# Patient Record
Sex: Male | Born: 2005 | Hispanic: No | Marital: Single
Health system: Southern US, Community
[De-identification: ages and names within clinical notes are randomized; demographics above are authoritative.]

## PROBLEM LIST (undated history)

## (undated) DIAGNOSIS — Z789 Other specified health status: Secondary | ICD-10-CM

## (undated) DIAGNOSIS — Z9109 Other allergy status, other than to drugs and biological substances: Secondary | ICD-10-CM

## (undated) HISTORY — DX: Other specified health status: Z78.9

---
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
## 2023-12-28 ENCOUNTER — Ambulatory Visit
Admission: EM | Admit: 2023-12-28 | Discharge: 2023-12-28 | Disposition: A | Discharge status: Home / Self Care | Attending: Emergency Medicine | Admitting: Emergency Medicine

## 2023-12-28 DIAGNOSIS — L03012 Cellulitis of left finger: Secondary | ICD-10-CM | POA: Diagnosis not present

## 2023-12-28 DIAGNOSIS — L089 Local infection of the skin and subcutaneous tissue, unspecified: Secondary | ICD-10-CM | POA: Diagnosis not present

## 2023-12-28 DIAGNOSIS — T148XXA Other injury of unspecified body region, initial encounter: Secondary | ICD-10-CM | POA: Diagnosis not present

## 2023-12-28 DIAGNOSIS — L03113 Cellulitis of right upper limb: Secondary | ICD-10-CM

## 2023-12-28 MED ORDER — DOXYCYCLINE HYCLATE 100 MG PO CAPS: 100.0000 mg | ORAL_CAPSULE | Freq: Two times a day (BID) | ORAL | 0 refills | Status: AC

## 2023-12-28 MED ORDER — TETANUS-DIPHTH-ACELL PERTUSSIS 5-2.5-18.5 LF-MCG/0.5 IM SUSY: 0.5000 mL | PREFILLED_SYRINGE | Freq: Once | INTRAMUSCULAR | Status: DC

## 2023-12-28 NOTE — ED Provider Notes (Signed)
 Harold Rhodes    CSN: [id number] Arrival date & time: 12/28/23  1030      History   Chief Complaint Chief Complaint  Patient presents with   Laceration   Hand Injury    HPI Harold Rhodes is a 18 y.o. male.  Patient presents with pain, swelling, redness on his left palm at the base of his little finger x 4 days.  He had a laceration approximately 1 week ago.  He did not seek treatment at that time.  The laceration healed but he is concerned for infection.  No fever or purulent drainage.  Last tetanus 2016.  The history is provided by the patient and medical records.    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 No pertinent surgical history.     Home Medications    Prior to Admission medications   Medication Sig Start Date End Date Taking? Authorizing Provider  doxycycline  (VIBRAMYCIN ) 100 MG capsule Take 1 capsule (100 mg total) by mouth 2 (two) times daily for 7 days. 12/28/23 01/04/24 Yes Corlis Burnard DEL, NP  cetirizine  (ZYRTEC ) 10 MG tablet TAKE 1 TABLET BY MOUTH EVERY DAY AT BEDTIME AND WHEN 07/17/20   Stanley, Angela J, MD  Clindamycin -Benzoyl Per, Refr, (DUAC) gel Apply 1 application topically 2 (two) times daily. 02/27/20   Dozier Nat CROME, MD  fluticasone  (FLONASE ) 50 MCG/ACT nasal spray Place 2 sprays into both nostrils daily. Computer only  enters ml or g - please give one bottle and refill as needed. 03/08/19 04/07/19  Romualdo Will BROCKS, MD    Family History Family History  Problem Relation Age of Onset   Alcohol abuse Father    Hyperlipidemia Maternal Grandmother    Hearing loss Maternal Grandfather        war injury   Hyperlipidemia Maternal Grandfather    Hyperlipidemia Paternal Grandmother    Hyperlipidemia Paternal Grandfather     Social History Social History   Tobacco Use   Smoking status: Passive Smoke Exposure - Never Smoker   Smokeless tobacco: Never  Vaping Use   Vaping status: Never Used  Substance Use Topics   Alcohol use: No   Drug use: Yes    Types: Marijuana    Comment: states last time was last week     Allergies   Patient has no known allergies.   Review of Systems Review of Systems  Constitutional:  Negative for chills and fever.  Musculoskeletal:  Positive for joint swelling. Negative for arthralgias.  Skin:  Positive for color change and wound.  Neurological:  Negative for weakness and numbness.     Physical Exam Triage Vital Signs ED Triage Vitals  Encounter Vitals Group     BP 12/28/23 1137 122/79     Girls Systolic BP Percentile --      Girls Diastolic BP Percentile --      Boys Systolic BP Percentile --  Boys Diastolic BP Percentile --      Pulse Rate 12/28/23 1137 65     Resp 12/28/23 1137 18     Temp 12/28/23 1137 (!) 97.2 F (36.2 C)     Temp Source 12/28/23 1137 Temporal     SpO2 12/28/23 1137 100 %     Weight --      Height --      Head Circumference --      Peak Flow --      Pain Score 12/28/23 1136 6     Pain Loc --      Pain Education --      Exclude from Growth Chart --    No data found.  Updated Vital Signs BP 122/79 (BP Location: Left Arm)   Pulse 65   Temp (!) 97.2 F (36.2 C) (Temporal)   Resp 18   SpO2 100%   Visual Acuity Right Eye Distance:   Left Eye Distance:   Bilateral Distance:    Right Eye Near:   Left Eye Near:     Bilateral Near:     Physical Exam Constitutional:      General: He is not in acute distress. HENT:     Mouth/Throat:     Mouth: Mucous membranes are moist.  Cardiovascular:     Rate and Rhythm: Normal rate and regular rhythm.  Pulmonary:     Effort: Pulmonary effort is normal. No respiratory distress.  Musculoskeletal:        General: Swelling and tenderness present. No deformity. Normal range of motion.  Skin:    General: Skin is warm and dry.     Capillary Refill: Capillary refill takes less than 2 seconds.     Findings: Erythema and lesion present.     Comments: Left hand has full range of motion, sensation intact, brisk capillary refill, strength 5/5.  Mild edema, erythema, tenderness of left hand at the base of the little finger on the palmar side at the site of closed laceration.  Single stab incision with return of blood only; no purulent drainage.  Neurological:     General: No focal deficit present.     Mental Status: He is alert.     Sensory: No sensory deficit.     Motor: No weakness.      UC Treatments / Results  Labs (all labs ordered are listed, but only abnormal results are displayed) Labs Reviewed - No data to display  EKG   Radiology No results found.  Procedures Procedures (including critical care time)  Medications Ordered in UC Medications - No data to display   Initial Impression / Assessment and Plan / UC Course  I have reviewed the triage vital signs and the nursing notes.  Pertinent labs & imaging results that were available during my care of the patient were reviewed by me and considered in my medical decision making (see chart for details).    Cellulitis due to infected wound of the left hand at the base of the little finger.  Single stab incision at the site as blood return only and no purulent drainage.  Treating today with doxycycline .  Wound care instructions discussed.  Instructed patient to follow-up with his PCP or a hand  specialist.  Contact information for on-call orthopedic hand specialist provided.  Education provided on cellulitis and infected wounds.  Patient adamantly declines tetanus update due to fear of needles.  He agrees to plan of care.  Final Clinical Impressions(s) / UC Diagnoses  Final diagnoses:  Cellulitis of finger of left hand  Infected wound     Discharge Instructions      According to your chart, your last tetanus shot was in 2016 and needs to be updated today but you have declined this.  Take the doxycycline  as directed.    Keep your wound clean and dry.  Wash it gently twice a day with soap and water.  Then apply an antibiotic cream and bandage.    Follow up with your primary care provider or a hand specialist such as the one listed below.        ED Prescriptions     Medication Sig Dispense Auth. Provider   doxycycline  (VIBRAMYCIN ) 100 MG capsule Take 1 capsule (100 mg total) by mouth 2 (two) times daily for 7 days. 14 capsule Corlis Burnard DEL, NP      PDMP not reviewed this encounter.   Corlis Burnard DEL, NP 12/28/23 1252

## 2023-12-28 NOTE — Discharge Instructions (Addendum)
 According to your chart, your last tetanus shot was in 2016 and needs to be updated today but you have declined this.  Take the doxycycline  as directed.    Keep your wound clean and dry.  Wash it gently twice a day with soap and water.  Then apply an antibiotic cream and bandage.    Follow up with your primary care provider or a hand specialist such as the one listed below.

## 2023-12-28 NOTE — ED Triage Notes (Signed)
 Patient states that he cut his right palm of his hand about 1.5 weeks ago. Patient states that the cut was fine until about 4 days ago the area started getting red and heat to the area. Pain is 6-10. Patient is a Designer, fashion/clothing and wears gloves. Patient states that he's unsure of his last t-dap.

## 2024-01-01 ENCOUNTER — Other Ambulatory Visit: Payer: Self-pay

## 2024-01-01 ENCOUNTER — Ambulatory Visit: Admission: EM | Admit: 2024-01-01 | Discharge: 2024-01-01 | Disposition: A | Discharge status: Home / Self Care

## 2024-01-01 ENCOUNTER — Emergency Department
Admission: EM | Admit: 2024-01-01 | Discharge: 2024-01-01 | Disposition: A | Discharge status: Home / Self Care | Attending: Emergency Medicine | Admitting: Emergency Medicine

## 2024-01-01 DIAGNOSIS — L03113 Cellulitis of right upper limb: Secondary | ICD-10-CM | POA: Diagnosis not present

## 2024-01-01 DIAGNOSIS — L0291 Cutaneous abscess, unspecified: Secondary | ICD-10-CM

## 2024-01-01 DIAGNOSIS — R2231 Localized swelling, mass and lump, right upper limb: Secondary | ICD-10-CM | POA: Diagnosis present

## 2024-01-01 DIAGNOSIS — L02511 Cutaneous abscess of right hand: Secondary | ICD-10-CM | POA: Insufficient documentation

## 2024-01-01 MED ORDER — OXYCODONE-ACETAMINOPHEN 5-325 MG PO TABS
1.0000 | ORAL_TABLET | Freq: Once | ORAL | Status: AC
  Administered 2024-01-01: 1 via ORAL
  Filled 2024-01-01: qty 1

## 2024-01-01 MED ORDER — CLINDAMYCIN HCL 300 MG PO CAPS: 300.0000 mg | ORAL_CAPSULE | Freq: Three times a day (TID) | ORAL | 0 refills | Status: AC

## 2024-01-01 MED ORDER — ONDANSETRON 4 MG PO TBDP
4.0000 mg | ORAL_TABLET | Freq: Once | ORAL | Status: AC
  Administered 2024-01-01: 4 mg via ORAL
  Filled 2024-01-01: qty 1

## 2024-01-01 MED ORDER — LIDOCAINE-PRILOCAINE 2.5-2.5 % EX CREA
TOPICAL_CREAM | Freq: Once | CUTANEOUS | Status: AC
  Filled 2024-01-01: qty 5

## 2024-01-01 MED ORDER — LORAZEPAM 1 MG PO TABS
1.0000 mg | ORAL_TABLET | Freq: Once | ORAL | Status: AC
  Administered 2024-01-01: 1 mg via ORAL
  Filled 2024-01-01: qty 1

## 2024-01-01 NOTE — ED Provider Notes (Signed)
 [facility] Provider Note    Event Date/Time   First MD Initiated Contact with Patient 01/01/24 1205     (approximate)  History   Chief Complaint: Hand Injury  HPI  Harold Rhodes is a 18 y.o. male with no significant past medical history presents to the emergency department for a right hand infection.  According to the patient about 2 weeks ago he cut his hand at the base of his small finger.  He states over the past 1 week there appears to have been an infection with increased swelling and redness to the area.  He went to urgent care 4 days ago and was started on doxycycline .  Was told at that time there was nothing to drain.  He states that the swelling has gotten a little worse so he returned to urgent care today and they sent him to the emergency department.  Patient denies any fever.  States he has been taking the doxycycline .  Physical Exam   Triage Vital Signs: ED Triage Vitals  Encounter Vitals Group     BP 01/01/24 1200 (!) 151/79     Girls Systolic BP Percentile --      Girls Diastolic BP Percentile --      Boys Systolic BP Percentile --      Boys Diastolic BP Percentile --      Pulse Rate 01/01/24 1158 88     Resp 01/01/24 1158 16     Temp 01/01/24 1158 98.2 F (36.8 C)     Temp src --      SpO2 01/01/24 1158 100 %     Weight 01/01/24 1159 165 lb (74.8 kg)     Height 01/01/24 1159 6' 2 (1.88 m)     Head Circumference --      Peak Flow --      Pain Score 01/01/24 1159 8     Pain Loc --      Pain Education --      Exclude from Growth Chart --     Most recent vital signs: Vitals:   01/01/24 1158 01/01/24 1200  BP:  (!) 151/79  Pulse: 88   Resp: 16   Temp: 98.2 F (36.8 C)   SpO2: 100%     General: Awake, no distress.  CV:  Good peripheral perfusion.  Regular rate and rhythm  Resp:  Normal effort.  Equal breath sounds bilaterally.  Abd:  No distention.  Soft Other:  Patient has erythema and swelling to the base of the 5th and  4th digits on the palmar aspect of the right hand.  Possible area of fluctuance/abscess to this area.   ED Results / Procedures / Treatments    MEDICATIONS ORDERED IN ED: Medications  lidocaine -prilocaine  (EMLA ) cream (has no administration in time range)  LORazepam  (ATIVAN ) tablet 1 mg (has no administration in time range)     IMPRESSION / MDM / ASSESSMENT AND PLAN / ED COURSE  I reviewed the triage vital signs and the nursing notes.  Patient's presentation is most consistent with acute presentation with potential threat to life or bodily function.  Patient presents to the emergency department for swelling redness and pain to the base of the right hand.  The area appears consistent with cellulitis versus abscess.  I used a bedside ultrasound and there does appear to be a small fluid collection in this area.  Patient is extremely nervous, and is adamant that we not use any needles.  I spoke  to the patient regarding the infection and my desire to check labs and possibly dose IV antibiotics.  Again patient adamant that there not be any needles no blood draws or IV.  Offered to dose the patient anxiety medication but again still does not want any blood drawn or an IV.  The area of infection is fairly small, and there does appear to be a drainable portion consistent with a small abscess.  We will attempt to drain the abscess.  If we were able to drain the abscess I believe the patient could be tried again on home antibiotics such as clindamycin .  Again patient will not even allow the lidocaine  to be administered through a needle.  Will attempt to topically numb with Emla  cream.  Will dose 1 mg of oral Ativan  and attempt a drainage of this area.  After much convincing in helping hold the patient he is agreeable to attempting lancing.  It was very difficult to get the patient to hold his head still to get it done but we were able to do so.  Patient had significant amount (4+ cc) of exudate return.  We  will take the patient off doxycycline  and place the patient on clindamycin  3 times daily x 10 days have the patient follow-up with his doctor.  Patient agreeable.  INCISION AND DRAINAGE Performed by: Franky Moores Consent: Verbal consent obtained. Risks and benefits: risks, benefits and alternatives were discussed Type: abscess  Body area:  right palm  Anesthesia:  topical lidocaine   Incision was made with a scalpel.  Complexity: complex  Drainage: purulent  Drainage amount: 4+cc  Packing material: Would not tolerate     FINAL CLINICAL IMPRESSION(S) / ED DIAGNOSES   Cellulitis Abscess of the right hand   Note:  This document was prepared using Dragon voice recognition software and may include unintentional dictation errors.   Moores Franky, MD 01/01/24 1410

## 2024-01-01 NOTE — Discharge Instructions (Addendum)
 Please begin taking clindamycin  3 times daily for 10 days.  Return to the emergency department for any increased swelling redness or fever.  Otherwise follow-up with your doctor.

## 2024-01-01 NOTE — ED Triage Notes (Signed)
 Pt diagnosed with cellulitis to right hand x1 week ago, has been taking antibiotics but reports swelling and pain is worsening.

## 2024-01-01 NOTE — ED Provider Notes (Signed)
 [name]    CSN: [id number] Arrival date & time: 01/01/24  1038      History   Chief Complaint Chief Complaint  Patient presents with   Wound Infection    HPI Harold Rhodes is a 18 y.o. male.  Patient presents with increased redness, swelling, pain of his right hand at the base of his little finger where he previously had a laceration.  He is currently on doxycycline .  He denies fever or chills.  Patient was seen here on 12/28/2023 for cellulitis and infected wound of his right hand; treated with doxycycline .  Patient declined tetanus update at that visit.  The history is provided by the patient and medical records.    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 No pertinent surgical history.     Home Medications    Prior to Admission medications   Medication Sig Start Date End Date Taking? Authorizing Provider  cetirizine  (ZYRTEC ) 10 MG tablet TAKE 1 TABLET BY MOUTH EVERY DAY AT BEDTIME AND WHEN 07/17/20   Stanley, Angela J, MD  Clindamycin -Benzoyl Per, Refr, (DUAC) gel Apply 1 application topically 2 (two) times daily. 02/27/20   Dozier Nat CROME, MD  doxycycline  (VIBRAMYCIN ) 100 MG capsule Take 1 capsule (100 mg total) by mouth 2 (two) times daily for 7 days. 12/28/23 01/04/24  Corlis Burnard DEL, NP  fluticasone  (FLONASE ) 50 MCG/ACT nasal spray  Place 2 sprays into both nostrils daily. Computer only enters ml or g - please give one bottle and refill as needed. 03/08/19 04/07/19  Romualdo Will BROCKS, MD    Family History Family History  Problem Relation Age of Onset   Alcohol abuse Father    Hyperlipidemia Maternal Grandmother    Hearing loss Maternal Grandfather        war injury   Hyperlipidemia Maternal Grandfather    Hyperlipidemia Paternal Grandmother    Hyperlipidemia Paternal Grandfather     Social History Social History   Tobacco Use   Smoking status: Passive Smoke Exposure - Never Smoker   Smokeless tobacco: Never  Vaping Use   Vaping status: Never Used  Substance Use Topics   Alcohol use: No   Drug use: Yes    Types: Marijuana    Comment: states last time was last week     Allergies   Patient has no known allergies.   Review of Systems Review of Systems  Constitutional:  Negative for chills and fever.  Musculoskeletal:  Positive for arthralgias and joint swelling.  Skin:  Positive for color change and wound.     Physical Exam Triage Vital Signs ED Triage Vitals  Encounter Vitals Group     BP 01/01/24 1049 133/78     Girls Systolic BP Percentile --      Girls Diastolic BP Percentile --      Boys Systolic BP Percentile --      Boys Diastolic BP Percentile --  Pulse Rate 01/01/24 1049 98     Resp 01/01/24 1049 18     Temp 01/01/24 1049 97.8 F (36.6 C)     Temp src --      SpO2 01/01/24 1049 100 %     Weight --      Height --      Head Circumference --      Peak Flow --      Pain Score 01/01/24 1046 8     Pain Loc --      Pain Education --      Exclude from Growth Chart --    No data found.  Updated Vital Signs BP 133/78   Pulse 98   Temp 97.8 F (36.6 C)   Resp 18   SpO2 100%   Visual Acuity Right Eye Distance:   Left Eye Distance:   Bilateral Distance:    Right Eye Near:   Left Eye Near:    Bilateral Near:     Physical Exam Constitutional:      General: He is  not in acute distress. HENT:     Mouth/Throat:     Mouth: Mucous membranes are moist.  Cardiovascular:     Rate and Rhythm: Normal rate and regular rhythm.  Pulmonary:     Effort: Pulmonary effort is normal. No respiratory distress.  Musculoskeletal:        General: Swelling and tenderness present.  Skin:    General: Skin is warm and dry.     Findings: Erythema present.     Comments: Cellulitis of right hand is worse than previous visit, despite being on doxycycline  which patient reports he is taking as directed.  Increased erythema and edema.  See pictures for details.  Neurological:     General: No focal deficit present.     Mental Status: He is alert.     Sensory: No sensory deficit.     Motor: No weakness.         UC Treatments / Results  Labs (all labs ordered are listed, but only abnormal results are displayed) Labs Reviewed - No data to display  EKG   Radiology No results found.  Procedures Procedures (including critical care time)  Medications Ordered in UC Medications - No data to display  Initial Impression / Assessment and Plan / UC Course  I have reviewed the triage vital signs and the nursing notes.  Pertinent labs & imaging results that were available during my care of the patient were reviewed by me and considered in my medical decision making (see chart for details).    Worsening cellulitis of right hand.  Patient is currently on doxycycline  and his symptoms are markedly worse.  He has increased swelling, redness, pain.  Sending him to the ED for evaluation.  He is agreeable to this and will go to [facility] ED now.  Final Clinical Impressions(s) / UC Diagnoses   Final diagnoses:  Cellulitis of right hand     Discharge Instructions      Go to the emergency department for evaluation of the worsening infection in your hand.     ED Prescriptions   None    PDMP not reviewed this encounter.   Corlis Burnard DEL, NP 01/01/24 1136

## 2024-01-01 NOTE — Discharge Instructions (Signed)
 Go to the emergency department for evaluation of the worsening infection in your hand.

## 2024-01-01 NOTE — ED Notes (Signed)
 ARea was still draining serosang fluid. Wound was dressed. Patient tolerated procedure well.

## 2024-01-01 NOTE — ED Notes (Signed)
 Pt requesting anxiety or pain meds before bloodwork

## 2024-01-01 NOTE — ED Triage Notes (Signed)
 Patient to Urgent Care with complaints of worsening swelling/ redness to his right palm.   Currently taking doxycyline/ ibuprofen . Reports he now has a large accumulation of pus under the area of infection.  Symptoms x6-7 days.

## 2024-01-01 NOTE — ED Notes (Signed)
 Patient is being discharged from the Urgent Care and sent to the Emergency Department via POV . Per Burnard Cork NP, patient is in need of higher level of care due to wound infection. Patient is aware and verbalizes understanding of plan of care.  Vitals:   01/01/24 1049  BP: 133/78  Pulse: 98  Resp: 18  Temp: 97.8 F (36.6 C)  SpO2: 100%

## 2024-04-04 ENCOUNTER — Ambulatory Visit
Admission: EM | Admit: 2024-04-04 | Discharge: 2024-04-04 | Disposition: A | Discharge status: Home / Self Care | Attending: Emergency Medicine | Admitting: Emergency Medicine

## 2024-04-04 DIAGNOSIS — Z113 Encounter for screening for infections with a predominantly sexual mode of transmission: Secondary | ICD-10-CM | POA: Insufficient documentation

## 2024-04-04 DIAGNOSIS — R300 Dysuria: Secondary | ICD-10-CM | POA: Diagnosis not present

## 2024-04-04 DIAGNOSIS — R369 Urethral discharge, unspecified: Secondary | ICD-10-CM | POA: Insufficient documentation

## 2024-04-04 LAB — POCT URINE DIPSTICK
Bilirubin, UA: NEGATIVE
Glucose, UA: NEGATIVE mg/dL
Ketones, POC UA: NEGATIVE mg/dL
Nitrite, UA: NEGATIVE
Protein Ur, POC: 30 mg/dL — AB
Spec Grav, UA: 1.02 (ref 1.010–1.025)
Urobilinogen, UA: 1 U/dL
pH, UA: 8.5 — AB (ref 5.0–8.0)

## 2024-04-04 MED ORDER — SULFAMETHOXAZOLE-TRIMETHOPRIM 800-160 MG PO TABS: 1.0000 | ORAL_TABLET | Freq: Two times a day (BID) | ORAL | 0 refills | Status: AC

## 2024-04-04 NOTE — Discharge Instructions (Addendum)
 Take the antibiotic as directed.  The urine culture is pending.  We will call you if it shows the need to change or discontinue your antibiotic.    Your tests are pending.  Your test results will be available on your MyChart account.  If your test results are positive, we will call you.  You and your sexual partner(s) may require treatment at that time.  Do not have sexual activity until all test results are back and treatment is completed if needed.  Follow-up with your primary care provider if your symptoms are not improving.

## 2024-04-04 NOTE — ED Triage Notes (Signed)
 Patient to Urgent Care with complaints of penile discharge/ dysuria/ urinary frequency.  Symptoms x3-5 days. No fevers.   No otc meds.

## 2024-04-04 NOTE — ED Provider Notes (Signed)
 [name]    CSN: [id number] Arrival date & time: 04/04/24  1324      History   Chief Complaint Chief Complaint  Patient presents with   Urinary Frequency   Dysuria    HPI Harold Rhodes is a 18 y.o. male.  Patient presents with 3-day history of dysuria, urinary frequency, penile discharge.  No treatments attempted.  No fever, rash, lesions, hematuria, flank pain, abdominal pain, testicular pain.  He denies history of UTIs or STDs.  He is sexually active and does not consistently use condoms.  The history is provided by the patient and medical records.    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 No pertinent surgical history.     Home Medications    Prior to Admission medications   Medication Sig Start Date End Date Taking? Authorizing Provider  sulfamethoxazole -trimethoprim  (BACTRIM  DS) 800-160 MG tablet Take 1 tablet by mouth 2 (two) times daily for 5 days. 04/04/24 04/09/24 Yes Corlis Burnard DEL, NP  cetirizine  (ZYRTEC ) 10 MG tablet TAKE 1 TABLET BY MOUTH EVERY DAY AT BEDTIME AND WHEN Patient not taking: Reported on 04/04/2024 07/17/20   Stanley, Angela J, MD  Clindamycin -Benzoyl Per, Refr, (DUAC) gel Apply 1 application topically 2 (two) times daily. Patient not taking: Reported on 04/04/2024 02/27/20   Dozier Nat CROME, MD  fluticasone   (FLONASE ) 50 MCG/ACT nasal spray Place 2 sprays into both nostrils daily. Computer only enters ml or g - please give one bottle and refill as needed. Patient not taking: Reported on 04/04/2024 03/08/19 04/07/19  Romualdo Will BROCKS, MD    Family History Family History  Problem Relation Age of Onset   Alcohol abuse Father    Hyperlipidemia Maternal Grandmother    Hearing loss Maternal Grandfather        war injury   Hyperlipidemia Maternal Grandfather    Hyperlipidemia Paternal Grandmother    Hyperlipidemia Paternal Grandfather     Social History Social History   Tobacco Use   Smoking status: Passive Smoke Exposure - Never Smoker   Smokeless tobacco: Never  Vaping Use   Vaping status: Every Day  Substance Use Topics   Alcohol use: No   Drug use: Yes    Types: Marijuana    Comment: states last time was last week     Allergies   Patient has no known allergies.   Review of Systems Review of Systems  Constitutional:  Negative for chills and fever.  Gastrointestinal:  Negative for abdominal pain.  Genitourinary:  Positive for dysuria, frequency and penile discharge. Negative for flank pain, genital sores, hematuria and testicular pain.     Physical Exam Triage Vital Signs ED Triage Vitals [04/04/24 1431]  Encounter Vitals Group     BP      Girls Systolic BP Percentile      Girls Diastolic BP Percentile      Boys Systolic BP Percentile  Boys Diastolic BP Percentile      Pulse      Resp      Temp      Temp src      SpO2      Weight      Height      Head Circumference      Peak Flow      Pain Score 0     Pain Loc      Pain Education      Exclude from Growth Chart    No data found.  Updated Vital Signs BP 116/80   Pulse 80   Temp 98 F (36.7 C)   Resp 18   SpO2 98%   Visual Acuity Right Eye Distance:   Left Eye Distance:   Bilateral Distance:    Right Eye Near:   Left Eye Near:    Bilateral Near:     Physical Exam Constitutional:       General: He is not in acute distress. HENT:     Mouth/Throat:     Mouth: Mucous membranes are moist.  Cardiovascular:     Rate and Rhythm: Normal rate.  Pulmonary:     Effort: Pulmonary effort is normal. No respiratory distress.  Abdominal:     General: Bowel sounds are normal.     Palpations: Abdomen is soft.     Tenderness: There is no abdominal tenderness. There is no right CVA tenderness, left CVA tenderness, guarding or rebound.  Genitourinary:    Comments: Patient declines GU exam. Neurological:     Mental Status: He is alert.      UC Treatments / Results  Labs (all labs ordered are listed, but only abnormal results are displayed) Labs Reviewed  POCT URINE DIPSTICK - Abnormal; Notable for the following components:      Result Value   Color, UA light yellow (*)    Clarity, UA cloudy (*)    Blood, UA trace-intact (*)    pH, UA 8.5 (*)    Protein Ur, POC =30 (*)    Leukocytes, UA Small (1+) (*)    All other components within normal limits  URINE CULTURE  CYTOLOGY, (ORAL, ANAL, URETHRAL) ANCILLARY ONLY    EKG   Radiology No results found.  Procedures Procedures (including critical care time)  Medications Ordered in UC Medications - No data to display  Initial Impression / Assessment and Plan / UC Course  I have reviewed the triage vital signs and the nursing notes.  Pertinent labs & imaging results that were available during my care of the patient were reviewed by me and considered in my medical decision making (see chart for details).   Penile discharge, dysuria, STD screening.  Afebrile and vital signs are stable.  Patient denies history of UTIs or STDs.  He is sexually active.  His urine is positive for leukocytes and blood.  Urine culture pending.  Treating today with Bactrim .  Discussed with patient that we will call if the urine culture shows the need to change or discontinue the antibiotic.  Also discussed with patient that his STD tests are pending and  that we will call if treatment is needed.  Instructed him to abstain from all sexual activity until all test results are back and treatment is completed if needed.  He agrees to plan of care.    Final Clinical Impressions(s) / UC Diagnoses   Final diagnoses:  Penile discharge  Dysuria  Screening for STD (sexually transmitted disease)  Discharge Instructions      Take the antibiotic as directed.  The urine culture is pending.  We will call you if it shows the need to change or discontinue your antibiotic.    Your tests are pending.  Your test results will be available on your MyChart account.  If your test results are positive, we will call you.  You and your sexual partner(s) may require treatment at that time.  Do not have sexual activity until all test results are back and treatment is completed if needed.  Follow-up with your primary care provider if your symptoms are not improving.      ED Prescriptions     Medication Sig Dispense Auth. Provider   sulfamethoxazole -trimethoprim  (BACTRIM  DS) 800-160 MG tablet Take 1 tablet by mouth 2 (two) times daily for 5 days. 10 tablet Corlis Burnard DEL, NP      PDMP not reviewed this encounter.   Corlis Burnard DEL, NP 04/04/24 1515

## 2024-04-05 LAB — CYTOLOGY, (ORAL, ANAL, URETHRAL) ANCILLARY ONLY
Chlamydia: POSITIVE — AB
Comment: NEGATIVE
Comment: NEGATIVE
Comment: NORMAL
Neisseria Gonorrhea: NEGATIVE
Trichomonas: NEGATIVE

## 2024-04-05 LAB — URINE CULTURE: Culture: NO GROWTH

## 2024-04-10 ENCOUNTER — Ambulatory Visit (HOSPITAL_COMMUNITY): Payer: Self-pay

## 2024-04-10 MED ORDER — DOXYCYCLINE HYCLATE 100 MG PO TABS: 100.0000 mg | ORAL_TABLET | Freq: Two times a day (BID) | ORAL | 0 refills | Status: AC
